# Patient Record
Sex: Female | Born: 1985 | Race: Black or African American | Hispanic: No | Marital: Single | Smoking: Never smoker
Health system: Southern US, Community
[De-identification: ages and names within clinical notes are randomized; demographics above are authoritative.]

## PROBLEM LIST (undated history)

## (undated) DIAGNOSIS — Z789 Other specified health status: Secondary | ICD-10-CM

## (undated) HISTORY — PX: WISDOM TOOTH EXTRACTION: SHX21

---

## 2017-05-18 LAB — OB RESULTS CONSOLE ABO/RH: RH Type: POSITIVE

## 2017-05-18 LAB — OB RESULTS CONSOLE HEPATITIS B SURFACE ANTIGEN: Hepatitis B Surface Ag: NEGATIVE

## 2017-05-18 LAB — OB RESULTS CONSOLE GC/CHLAMYDIA
CHLAMYDIA, DNA PROBE: NEGATIVE
GC PROBE AMP, GENITAL: NEGATIVE

## 2017-05-18 LAB — OB RESULTS CONSOLE HIV ANTIBODY (ROUTINE TESTING): HIV: NONREACTIVE

## 2017-05-18 LAB — OB RESULTS CONSOLE ANTIBODY SCREEN: ANTIBODY SCREEN: NEGATIVE

## 2017-05-18 LAB — OB RESULTS CONSOLE RPR: RPR: NONREACTIVE

## 2017-05-18 LAB — OB RESULTS CONSOLE RUBELLA ANTIBODY, IGM: RUBELLA: IMMUNE

## 2017-05-30 NOTE — L&D Delivery Note (Signed)
Delivery Note Pt eventually reached complete dilation OP presentation.  She continued to have intermittent variable decels but always responded to position change.   With pushing she had deeper decelerations to 70's, but would recover between.  Pt pushed very for about 15 minutes and spontaneously rotated the vertex to OA.   At 5:42 PM a viable female was delivered via Vaginal, Spontaneous (Presentation: ROA ).  APGAR:7 ,8 ; weight pending .  NICU was present at delivery for decelerations, but baby only required stimulation and did well.   Placenta status: delivered spontaneously.  Cord:  with the following complications: nuchal x 1 reduced.    Anesthesia:  epidural Episiotomy: None Lacerations: Periurethral abrasion Suture Repair: 3.0 vicryl rapide Est. Blood Loss (mL): 175mL  Mom to postpartum.  Baby to Couplet care / Skin to Skin. D/w pt circumcision and they desire to proceed, have paid at office and need to pay at hospital  Oliver PilaKathy W Kea Callan 12/15/2017, 5:57 PM

## 2017-11-22 LAB — OB RESULTS CONSOLE GBS: GBS: NEGATIVE

## 2017-12-15 ENCOUNTER — Encounter (HOSPITAL_COMMUNITY): Payer: Self-pay

## 2017-12-15 ENCOUNTER — Other Ambulatory Visit: Payer: Self-pay

## 2017-12-15 ENCOUNTER — Encounter (HOSPITAL_COMMUNITY): Payer: Self-pay | Admitting: *Deleted

## 2017-12-15 ENCOUNTER — Inpatient Hospital Stay (HOSPITAL_COMMUNITY)
Admission: AD | Admit: 2017-12-15 | Discharge: 2017-12-17 | DRG: 807 | Disposition: A | Discharge status: Home / Self Care | Payer: Medicaid Other | Attending: Obstetrics and Gynecology | Admitting: Obstetrics and Gynecology

## 2017-12-15 ENCOUNTER — Inpatient Hospital Stay (HOSPITAL_COMMUNITY): Payer: Medicaid Other | Admitting: Anesthesiology

## 2017-12-15 ENCOUNTER — Inpatient Hospital Stay (EMERGENCY_DEPARTMENT_HOSPITAL)
Admission: AD | Admit: 2017-12-15 | Discharge: 2017-12-15 | Disposition: A | Discharge status: Home / Self Care | Payer: Medicaid Other | Source: Ambulatory Visit | Attending: Obstetrics and Gynecology | Admitting: Obstetrics and Gynecology

## 2017-12-15 DIAGNOSIS — Z3A39 39 weeks gestation of pregnancy: Secondary | ICD-10-CM

## 2017-12-15 DIAGNOSIS — Z3480 Encounter for supervision of other normal pregnancy, unspecified trimester: Secondary | ICD-10-CM

## 2017-12-15 DIAGNOSIS — O479 False labor, unspecified: Secondary | ICD-10-CM

## 2017-12-15 DIAGNOSIS — O321XX0 Maternal care for breech presentation, not applicable or unspecified: Secondary | ICD-10-CM | POA: Diagnosis not present

## 2017-12-15 DIAGNOSIS — Z3483 Encounter for supervision of other normal pregnancy, third trimester: Secondary | ICD-10-CM | POA: Diagnosis present

## 2017-12-15 DIAGNOSIS — O329XX0 Maternal care for malpresentation of fetus, unspecified, not applicable or unspecified: Secondary | ICD-10-CM

## 2017-12-15 HISTORY — DX: Other specified health status: Z78.9

## 2017-12-15 LAB — CBC
HEMATOCRIT: 33.6 % — AB (ref 36.0–46.0)
HEMOGLOBIN: 11.6 g/dL — AB (ref 12.0–15.0)
MCH: 32.9 pg (ref 26.0–34.0)
MCHC: 34.5 g/dL (ref 30.0–36.0)
MCV: 95.2 fL (ref 78.0–100.0)
Platelets: 251 10*3/uL (ref 150–400)
RBC: 3.53 MIL/uL — AB (ref 3.87–5.11)
RDW: 14.5 % (ref 11.5–15.5)
WBC: 10.3 10*3/uL (ref 4.0–10.5)

## 2017-12-15 LAB — ABO/RH: ABO/RH(D): B POS

## 2017-12-15 LAB — TYPE AND SCREEN
ABO/RH(D): B POS
ANTIBODY SCREEN: NEGATIVE

## 2017-12-15 LAB — RPR: RPR: NONREACTIVE

## 2017-12-15 MED ORDER — SOD CITRATE-CITRIC ACID 500-334 MG/5ML PO SOLN: 30.0000 mL | ORAL | Status: DC | PRN

## 2017-12-15 MED ORDER — OXYTOCIN BOLUS FROM INFUSION
500.0000 mL | Freq: Once | INTRAVENOUS | Status: AC
  Administered 2017-12-15: 500 mL via INTRAVENOUS

## 2017-12-15 MED ORDER — ONDANSETRON HCL 4 MG/2ML IJ SOLN: 4.0000 mg | Freq: Four times a day (QID) | INTRAMUSCULAR | Status: DC | PRN

## 2017-12-15 MED ORDER — ACETAMINOPHEN 325 MG PO TABS: 650.0000 mg | ORAL_TABLET | ORAL | Status: DC | PRN

## 2017-12-15 MED ORDER — TETANUS-DIPHTH-ACELL PERTUSSIS 5-2.5-18.5 LF-MCG/0.5 IM SUSP: 0.5000 mL | Freq: Once | INTRAMUSCULAR | Status: DC

## 2017-12-15 MED ORDER — PHENYLEPHRINE 40 MCG/ML (10ML) SYRINGE FOR IV PUSH (FOR BLOOD PRESSURE SUPPORT)
80.0000 ug | PREFILLED_SYRINGE | INTRAVENOUS | Status: DC | PRN
  Administered 2017-12-15: 80 ug via INTRAVENOUS
  Filled 2017-12-15: qty 5

## 2017-12-15 MED ORDER — IBUPROFEN 600 MG PO TABS
600.0000 mg | ORAL_TABLET | Freq: Four times a day (QID) | ORAL | Status: DC
  Administered 2017-12-15 – 2017-12-17 (×7): 600 mg via ORAL
  Filled 2017-12-15 (×6): qty 1

## 2017-12-15 MED ORDER — LIDOCAINE HCL (PF) 1 % IJ SOLN
INTRAMUSCULAR | Status: DC | PRN
  Administered 2017-12-15: 13 mL via EPIDURAL

## 2017-12-15 MED ORDER — BENZOCAINE-MENTHOL 20-0.5 % EX AERO
1.0000 "application " | INHALATION_SPRAY | CUTANEOUS | Status: DC | PRN
  Administered 2017-12-15: 1 via TOPICAL
  Filled 2017-12-15: qty 56

## 2017-12-15 MED ORDER — BUPIVACAINE HCL (PF) 0.25 % IJ SOLN
INTRAMUSCULAR | Status: AC
  Filled 2017-12-15: qty 30

## 2017-12-15 MED ORDER — TERBUTALINE SULFATE 1 MG/ML IJ SOLN
0.2500 mg | Freq: Once | INTRAMUSCULAR | Status: DC | PRN
  Filled 2017-12-15: qty 1

## 2017-12-15 MED ORDER — ZOLPIDEM TARTRATE 5 MG PO TABS: 5.0000 mg | ORAL_TABLET | Freq: Every evening | ORAL | Status: DC | PRN

## 2017-12-15 MED ORDER — LACTATED RINGERS IV SOLN
500.0000 mL | Freq: Once | INTRAVENOUS | Status: AC
  Administered 2017-12-15: 500 mL via INTRAVENOUS

## 2017-12-15 MED ORDER — FENTANYL CITRATE (PF) 100 MCG/2ML IJ SOLN
100.0000 ug | Freq: Once | INTRAMUSCULAR | Status: AC
  Administered 2017-12-15: 100 ug via INTRAVENOUS

## 2017-12-15 MED ORDER — BUTORPHANOL TARTRATE 1 MG/ML IJ SOLN: 1.0000 mg | INTRAMUSCULAR | Status: DC | PRN

## 2017-12-15 MED ORDER — DIBUCAINE 1 % RE OINT: 1.0000 "application " | TOPICAL_OINTMENT | RECTAL | Status: DC | PRN

## 2017-12-15 MED ORDER — PHENYLEPHRINE 40 MCG/ML (10ML) SYRINGE FOR IV PUSH (FOR BLOOD PRESSURE SUPPORT)
80.0000 ug | PREFILLED_SYRINGE | INTRAVENOUS | Status: DC | PRN
  Filled 2017-12-15: qty 5

## 2017-12-15 MED ORDER — SENNOSIDES-DOCUSATE SODIUM 8.6-50 MG PO TABS
2.0000 | ORAL_TABLET | ORAL | Status: DC
  Administered 2017-12-16: 2 via ORAL
  Filled 2017-12-15 (×2): qty 2

## 2017-12-15 MED ORDER — LACTATED RINGERS IV SOLN
500.0000 mL | INTRAVENOUS | Status: DC | PRN
  Administered 2017-12-15 (×2): 500 mL via INTRAVENOUS

## 2017-12-15 MED ORDER — LIDOCAINE HCL (PF) 1 % IJ SOLN
30.0000 mL | INTRAMUSCULAR | Status: DC | PRN
  Filled 2017-12-15: qty 30

## 2017-12-15 MED ORDER — DIPHENHYDRAMINE HCL 25 MG PO CAPS: 25.0000 mg | ORAL_CAPSULE | Freq: Four times a day (QID) | ORAL | Status: DC | PRN

## 2017-12-15 MED ORDER — OXYCODONE-ACETAMINOPHEN 5-325 MG PO TABS: 1.0000 | ORAL_TABLET | ORAL | Status: DC | PRN

## 2017-12-15 MED ORDER — LACTATED RINGERS IV SOLN
INTRAVENOUS | Status: DC
  Administered 2017-12-15: 10:00:00 via INTRAUTERINE

## 2017-12-15 MED ORDER — WITCH HAZEL-GLYCERIN EX PADS: 1.0000 "application " | MEDICATED_PAD | CUTANEOUS | Status: DC | PRN

## 2017-12-15 MED ORDER — OXYCODONE-ACETAMINOPHEN 5-325 MG PO TABS: 2.0000 | ORAL_TABLET | ORAL | Status: DC | PRN

## 2017-12-15 MED ORDER — PRENATAL MULTIVITAMIN CH
1.0000 | ORAL_TABLET | Freq: Every day | ORAL | Status: DC
  Administered 2017-12-16 – 2017-12-17 (×2): 1 via ORAL
  Filled 2017-12-15 (×2): qty 1

## 2017-12-15 MED ORDER — FENTANYL 2.5 MCG/ML BUPIVACAINE 1/10 % EPIDURAL INFUSION (WH - ANES)
14.0000 mL/h | INTRAMUSCULAR | Status: DC | PRN
  Administered 2017-12-15 (×2): 14 mL/h via EPIDURAL
  Filled 2017-12-15: qty 100

## 2017-12-15 MED ORDER — FENTANYL CITRATE (PF) 100 MCG/2ML IJ SOLN
INTRAMUSCULAR | Status: AC
  Administered 2017-12-15: 100 ug via INTRAVENOUS
  Filled 2017-12-15: qty 2

## 2017-12-15 MED ORDER — ONDANSETRON HCL 4 MG PO TABS: 4.0000 mg | ORAL_TABLET | ORAL | Status: DC | PRN

## 2017-12-15 MED ORDER — EPHEDRINE 5 MG/ML INJ
10.0000 mg | INTRAVENOUS | Status: DC | PRN
  Filled 2017-12-15: qty 2

## 2017-12-15 MED ORDER — PHENYLEPHRINE 40 MCG/ML (10ML) SYRINGE FOR IV PUSH (FOR BLOOD PRESSURE SUPPORT)
PREFILLED_SYRINGE | INTRAVENOUS | Status: AC
  Administered 2017-12-15: 80 ug via INTRAVENOUS
  Filled 2017-12-15: qty 10

## 2017-12-15 MED ORDER — SIMETHICONE 80 MG PO CHEW: 80.0000 mg | CHEWABLE_TABLET | ORAL | Status: DC | PRN

## 2017-12-15 MED ORDER — DIPHENHYDRAMINE HCL 50 MG/ML IJ SOLN: 12.5000 mg | INTRAMUSCULAR | Status: DC | PRN

## 2017-12-15 MED ORDER — COCONUT OIL OIL: 1.0000 "application " | TOPICAL_OIL | Status: DC | PRN

## 2017-12-15 MED ORDER — FENTANYL 2.5 MCG/ML BUPIVACAINE 1/10 % EPIDURAL INFUSION (WH - ANES)
INTRAMUSCULAR | Status: AC
  Filled 2017-12-15: qty 100

## 2017-12-15 MED ORDER — LACTATED RINGERS IV SOLN
INTRAVENOUS | Status: DC
  Administered 2017-12-15: 10:00:00 via INTRAVENOUS

## 2017-12-15 MED ORDER — EPHEDRINE 5 MG/ML INJ
10.0000 mg | INTRAVENOUS | Status: DC | PRN
Start: 2017-12-15 — End: 2017-12-15
  Filled 2017-12-15: qty 2

## 2017-12-15 MED ORDER — ONDANSETRON HCL 4 MG/2ML IJ SOLN: 4.0000 mg | INTRAMUSCULAR | Status: DC | PRN

## 2017-12-15 MED ORDER — OXYTOCIN 40 UNITS IN LACTATED RINGERS INFUSION - SIMPLE MED
2.5000 [IU]/h | INTRAVENOUS | Status: DC
  Filled 2017-12-15: qty 1000

## 2017-12-15 MED ORDER — OXYTOCIN 40 UNITS IN LACTATED RINGERS INFUSION - SIMPLE MED
1.0000 m[IU]/min | INTRAVENOUS | Status: DC
  Administered 2017-12-15: 2 m[IU]/min via INTRAVENOUS

## 2017-12-15 NOTE — Discharge Instructions (Signed)
Cesarean Delivery °Cesarean birth, or cesarean delivery, is the surgical delivery of a baby through an incision in the abdomen and the uterus. This may be referred to as a C-section. This procedure may be scheduled ahead of time, or it may be done in an emergency situation. °Tell a health care provider about: °· Any allergies you have. °· All medicines you are taking, including vitamins, herbs, eye drops, creams, and over-the-counter medicines. °· Any problems you or family members have had with anesthetic medicines. °· Any blood disorders you have. °· Any surgeries you have had. °· Any medical conditions you have. °· Whether you or any members of your family have a history of deep vein thrombosis (DVT) or pulmonary embolism (PE). °What are the risks? °Generally, this is a safe procedure. However, problems may occur, including: °· Infection. °· Bleeding. °· Allergic reactions to medicines. °· Damage to other structures or organs. °· Blood clots. °· Injury to your baby. ° °What happens before the procedure? °· Follow instructions from your health care provider about eating or drinking restrictions. °· Follow instructions from your health care provider about bathing before your procedure to help reduce your risk of infection. °· If you know that you are going to have a cesarean delivery, do not shave your pubic area. Shaving before the procedure may increase your risk of infection. °· Ask your health care provider about: °? Changing or stopping your regular medicines. This is especially important if you are taking diabetes medicines or blood thinners. °? Your pain management plan. This is especially important if you plan to breastfeed your baby. °? How long you will be in the hospital after the procedure. °? Any concerns you may have about receiving blood products if you need them during the procedure. °? Cord blood banking, if you plan to collect your baby’s umbilical cord blood. °· You may also want to ask your  health care provider: °? Whether you will be able to hold or breastfeed your baby while you are still in the operating room. °? Whether your baby can stay with you immediately after the procedure and during your recovery. °? Whether a family member or a person of your choice can go with you into the operating room and stay with you during the procedure, immediately after the procedure, and during your recovery. °· Plan to have someone drive you home when you are discharged from the hospital. °What happens during the procedure? °· Fetal monitors will be placed on your abdomen to monitor your heart rate and your baby's heart rate. °· Depending on the reason for your cesarean delivery, you may have a physical exam or additional testing, such as an ultrasound. °· An IV tube will be inserted into one of your veins. °· You may have your blood or urine tested. °· You will be given antibiotic medicine to help prevent infection. °· You may be given a special warming gown to wear to keep your temperature stable. °· Hair may be removed from your pubic area. °· The skin of your pubic area and lower abdomen will be cleaned with a germ-killing solution (antiseptic). °· A catheter may be inserted into your bladder through your urethra. This drains your urine during the procedure. °· You may be given one or more of the following: °? A medicine to numb the area (local anesthetic). °? A medicine to make you fall asleep (general anesthetic). °? A medicine (regional anesthetic) that is injected into your back or through a small   thin tube placed in your back (spinal anesthetic or epidural anesthetic). This numbs everything below the injection site and allows you to stay awake during your procedure. If this makes you feel nauseous, tell your health care provider. Medicines will be available to help reduce any nausea you may feel.  An incision will be made in your abdomen, and then in your uterus.  If you are awake during your  procedure, you may feel tugging and pulling in your abdomen, but you should not feel pain. If you feel pain, tell your health care provider immediately.  Your baby will be removed from your uterus. You may feel more pressure or pushing while this happens.  Immediately after birth, your baby will be dried and kept warm. You may be able to hold and breastfeed your baby. The umbilical cord may be clamped and cut during this time.  Your placenta will be removed from your uterus.  Your incisions will be closed with stitches (sutures). Staples, skin glue, or adhesive strips may also be applied to the incision in your abdomen.  Bandages (dressings) will be placed over the incision in your abdomen. The procedure may vary among health care providers and hospitals. What happens after the procedure?  Your blood pressure, heart rate, breathing rate, and blood oxygen level will be monitored often until the medicines you were given have worn off.  You may continue to receive fluids and medicines through an IV tube.  You will have some pain. Medicines will be available to help control your pain.  To help prevent blood clots: ? You may be given medicines. ? You may have to wear compression stockings or devices. ? You will be encouraged to walk around when you are able.  Hospital staff will encourage and support bonding with your baby. Your hospital may allow you and your baby to stay in the same room (rooming in) during your hospital stay to encourage successful breastfeeding.  You may be encouraged to cough and breathe deeply often. This helps to prevent lung problems.  If you have a catheter draining your urine, it will be removed as soon as possible after your procedure. This information is not intended to replace advice given to you by your health care provider. Make sure you discuss any questions you have with your health care provider. Document Released: 05/16/2005 Document Revised: 10/22/2015  Document Reviewed: 02/24/2015 Elsevier Interactive Patient Education  2018 [employer]  Breech Birth What is a breech birth? A breech birth is when a baby is born with the buttocks or the feet first. Most babies are in a head down (vertex) position when they are born. There are three types of breech babies:  When the babys buttocks are showing first in the birth canal (vagina) with the legs straight up and the feet at the babys head (frank breech).  When the babys buttocks shows first with the legs bent at the knees and the feet down near the buttocks (complete breech).  When one or both of the babys feet are down below the buttocks (footling breech).  What are the risks of a breech birth? Having a breech birth increases the risk to your baby. A breech birth may cause the following:  Umbilical cord prolapse. This is when the umbilical cord is in front of the baby before or during labor. This can cause the cord to become pinched or compressed. This can reduce the flow of blood and oxygen to the baby.  The baby getting stuck  in the birth canal, which can cause injury or, rarely, death.  Injury to the nerves in the shoulder, arm, and hand (brachial plexus injury) when delivered.  Your baby being born too early (prematurely).  An increased need for a cesarean delivery.  What increases the risk of having a breech baby? It is not known what causes your baby to be breech. However, risk factors that may increase your chances of having a breech baby include the following:  The mother having had several babies already.  The mother having twins or more.  The mother having a baby with certain congenital disabilities.  The mother going into labor early.  The mother having problems with her uterus, such as a tumor.  The mother having placenta problems (placenta previa) or too much or not enough fluid surrounding the baby (amniotic fluid).  How do I know if my baby is breech? There  are no symptoms for you to know that your baby is breech. When you are close to your due date, your health care provider can tell if your baby is breech by:  An abdominal or vaginal (pelvic) exam.  An ultrasound.  Your health care provider may also be able to tell that your baby is breech if your babys heartbeat is heard above your belly button. What can be done if my baby is breech?  Your health care provider may try to turn the baby in your uterus. This is a procedure called external cephalic version (ECV). This is done by your health care provider. He or she will place both hands on your abdomen and gently and slowly turn the baby around. It is important to know that ECV can increase your chances of suddenly going into labor. If an ECV is done, it is done toward the end of a healthy pregnancy. The baby may remain in this position or he or she may turn back to the breech position. You and your health care provider will discuss if an ECV is recommended for you and your baby. How will I delivery my baby if my baby is breech? You and your health care provider will discuss the best way to deliver your baby. If your baby is breech, it is less likely that a vaginal delivery will be recommended due to the risks. Some breech babies may be delivered safely without a cesarean, while in other cases health care providers will recommend a cesarean delivery. This information is not intended to replace advice given to you by your health care provider. Make sure you discuss any questions you have with your health care provider. Document Released: 07/07/2006 Document Revised: 05/02/2016 Document Reviewed: 03/20/2014 Elsevier Interactive Patient Education  2017 [employer].

## 2017-12-15 NOTE — Anesthesia Preprocedure Evaluation (Signed)

## 2017-12-15 NOTE — Anesthesia Procedure Notes (Signed)
Epidural Patient location during procedure: OB Start time: 12/15/2017 8:11 AM End time: 12/15/2017 8:25 AM  Staffing Anesthesiologist: Lowella CurbMiller, Fonda Rochon Ray, MD Performed: anesthesiologist   Preanesthetic Checklist Completed: patient identified, site marked, surgical consent, pre-op evaluation, timeout performed, IV checked, risks and benefits discussed and monitors and equipment checked  Epidural Patient position: sitting Prep: ChloraPrep Patient monitoring: heart rate, cardiac monitor, continuous pulse ox and blood pressure Approach: midline Location: L2-L3 Injection technique: LOR saline  Needle:  Needle type: Tuohy  Needle gauge: 17 G Needle length: 9 cm Needle insertion depth: 4 cm Catheter type: closed end flexible Catheter size: 20 Guage Catheter at skin depth: 8 cm Test dose: negative  Assessment Events: blood not aspirated, injection not painful, no injection resistance, negative IV test and no paresthesia  Additional Notes Reason for block:procedure for pain

## 2017-12-15 NOTE — Lactation Note (Signed)
This note was copied from a baby's chart. Lactation Consultation Note  Patient Name: Jacqueline Tran Reason for consult: Initial assessment;Term  P3 mother whose infant is now 575 hours old.  Mother breastfed her first two children (Ages 3 and 8)  Baby is sleeping in grandfather's arms as I entered.  Mother feels like breastfeeding is going well so far and baby is latching appropriately.  She feels uterine contractions during feedings and no pain with latch.    Encouraged mother to feed 8-12 times/24 hours or more if baby shows feeding cues.  Reviewed feeding cues with mother.  Encouraged STS, breast massage and hand expression.  Colostrum container provided and mother will feed any EBM she obtains with hand expression.  In talking with her it became apparent that she remembered a lot about breastfeeding.  She asked appropriate questions.  I offered to observe a latch with the next feed and mother will call as needed for assistance.  Mom made aware of O/P services, breastfeeding support groups, community resources, and our phone # for post-discharge questions.  Family present.   Maternal Data Formula Feeding for Exclusion: No Has patient been taught Hand Expression?: Yes Does the patient have breastfeeding experience prior to this delivery?: Yes  Feeding Feeding Type: Breast Fed Length of feed: 15 min  LATCH Score Latch: Repeated attempts needed to sustain latch, nipple held in mouth throughout feeding, stimulation needed to elicit sucking reflex.  Audible Swallowing: A few with stimulation  Type of Nipple: Everted at rest and after stimulation  Comfort (Breast/Nipple): Soft / non-tender  Hold (Positioning): Full assist, staff holds infant at breast  LATCH Score: 6  Interventions    Lactation Tools Discussed/Used     Consult Status Consult Status: Follow-up Date: 12/16/17 Follow-up type: In-patient    Everson Mott R [name], 11:13  PM

## 2017-12-15 NOTE — Progress Notes (Signed)
Patient ID: Jacqueline Tran, female   DOB: 01-12-1986, 32 y.o.   MRN: [id number] Pt having variable decels.  Good variability and +accelerations  Cervix unchanged 90/6/-2  IUPC placed for amnioinfusion Will augment with pitocin if tracing allows. Follow progress

## 2017-12-15 NOTE — MAU Provider Note (Signed)
Chief Complaint:  Labor Eval   First Provider Initiated Contact with Patient 12/15/17 0304     HPI  HPI: Jacqueline Tran is a 32 y.o. G3P2002 at 39w1dwho presents to maternity admissions reporting labor.  She was being evaluated for labor by RN when the RN noted abnormal position.. She reports good fetal movement, denies LOF, vaginal itching/burning, urinary symptoms, h/a, dizziness, n/v, diarrhea, constipation or fever/chills.    Past Medical History: Past Medical History:  Diagnosis Date  . Medical history non-contributory     Past obstetric history: OB History  Gravida Para Term Preterm AB Living  3 2 2     2   SAB TAB Ectopic Multiple Live Births          2    # Outcome Date GA Lbr Len/2nd Weight Sex Delivery Anes PTL Lv  3 Current           2 Term 67     Vag-Spont   LIV  1 Term 2010     Vag-Spont   LIV    Past Surgical History: Past Surgical History:  Procedure Laterality Date  . WISDOM TOOTH EXTRACTION      Family History: No family history on file.  Social History: Social History   Tobacco Use  . Smoking status: Not on file  Substance Use Topics  . Alcohol use: Not on file  . Drug use: Not on file    Allergies:  Allergies  Allergen Reactions  . Ceclor [Cefaclor] Other (See Comments)    Unknown reaction  . Dimetapp Decongestant [Pseudoephedrine] Other (See Comments)    Unknown reaction    Meds:  Medications Prior to Admission  Medication Sig Dispense Refill Last Dose  . ferrous sulfate 325 (65 FE) MG EC tablet Take 325 mg by mouth 3 (three) times daily with meals.   12/14/2017 at Unknown time  . Prenatal Vit-Fe Fumarate-FA (MULTIVITAMIN-PRENATAL) 27-0.8 MG TABS tablet Take 1 tablet by mouth daily at 12 noon.   12/14/2017 at Unknown time    I have reviewed patient's Past Medical Hx, Surgical Hx, Family Hx, Social Hx, medications and allergies.   ROS:  Review of Systems  Constitutional: Negative for chills and fever.  Gastrointestinal:  Positive for abdominal pain.  Genitourinary: Positive for pelvic pain.  Musculoskeletal: Positive for back pain.   Other systems negative  Physical Exam   Patient Vitals for the past 24 hrs:  BP Temp Temp src Pulse Resp SpO2 Height Weight  12/15/17 0210 139/65 98 F (36.7 C) Oral 89 20 100 % 5\' 5"  (1.651 m) 150 lb (68 kg)   Constitutional: Well-developed, well-nourished female in no acute distress.  Cardiovascular: normal rate  Respiratory: normal effort,  GI: Abd soft, non-tender, gravid appropriate for gestational age.    MS: Extremities nontender, no edema, normal ROM Neurologic: Alert and oriented x 4.  GU: Neg CVAT.  PELVIC EXAM: Dilation: 3 Effacement (%): 60 Station: -3 Presentation: Undeterminable Exam by:: Pharmacologist  FHT:  Baseline 140 , moderate variability, accelerations present, no decelerations Contractions:  Irregular     Labs:    Imaging:  Pt informed that the ultrasound is considered a limited OB ultrasound and is not intended to be a complete ultrasound exam.  Patient also informed that the ultrasound is not being completed with the intent of assessing for fetal or placental anomalies or any pelvic abnormalities.  Explained that the purpose of today's ultrasound is to assess for presentation  Patient acknowledges the  purpose of the exam and the limitations of the study.    Fetus noted to be in the complete breech presentation, with longitudinal lie Head is in maternal LUQ.  Breech in in pelvis, though not engaged. Spine is along right side.  Pockets of amniotic fluid seen, not measured  MAU Course/MDM: RN will notify MD with report of fetal malpresentation  Assessment: Single intrauterine pregnancy at 7249w1d Malpresentation of fetus, breech  Plan: Discharge home Labor precautions and fetal kick counts Follow up in Office for prenatal visits and recheck  Encouraged to return here or to other Urgent Care/ED if she develops worsening of symptoms,  increase in pain, fever, or other concerning symptoms.   Pt stable at time of discharge.  Wynelle BourgeoisMarie Rashan Rounsaville CNM, MSN Certified Nurse-Midwife 12/15/2017 3:05 AM

## 2017-12-15 NOTE — MAU Note (Signed)
Pt here for irregular contractions all day now every 7-10 mins. Pt reports some bloody show and spotting. Denies LOF. Reports good fetal movement. Cervix was 3cm on Wednesday

## 2017-12-15 NOTE — Progress Notes (Signed)
Patient ID: Jacqueline Tran, female   DOB: 06-17-1985, 32 y.o.   MRN: [id number] Pt has been comfortable with epidural, some pressure She has had runs of deep variable decelerations which responded to amnioinfusion and repositioning.    Currently, has finally had 30 minutes with no variable decelerations and a category 1 strip so able to start pitocin augmentation.    Per RN exam, minimal change.  Vertex asynclitic and OP--will attempt to increase intensity of contractions to help pt progress if baby tolerates.

## 2017-12-15 NOTE — MAU Note (Signed)
I have communicated with Dr. Ellyn HackBovard and reviewed vital signs:  Vitals:   12/15/17 0210  BP: 139/65  Pulse: 89  Resp: 20  Temp: 98 F (36.7 C)  SpO2: 100%    Vaginal exam:  Dilation: 3 Effacement (%): 60 Station: -3 Presentation: Undeterminable Exam by:: Ciscomber Juno Bozard RN,   Also reviewed contraction pattern and that non-stress test is reactive.  It has been documented that patient is contracting every 9-10 minutes with no cervical change since last exam in the office, not indicating active labor.  Patient denies any other complaints. Breech presentation confirmed by Artelia LarocheM Williams CNM. Based on this report provider has given order for discharge.  A discharge order and diagnosis entered by a provider.   Labor discharge instructions reviewed with patient.

## 2017-12-15 NOTE — H&P (Signed)
Jacqueline Tran is a 32 y.o. female G3P2002 at 39+ with ctx and cervical change.  Was breech on check last night.  By US this AM vtx.  Relatively uncomplicated PNC, Pt is gbbs neg, declined Tdap.  Had normal genetic screening.    OB History    Gravida  3   Para  2   Term  2   Preterm      AB      Living  2     SAB      TAB      Ectopic      Multiple      Live Births  2         G1 SVD 5#12, female G2 SVD female, 7#8  + abn pap - ASCUS - HR HPV + with pregnancy No STD  Past Medical History:  Diagnosis Date  . Medical history non-contributory    Past Surgical History:  Procedure Laterality Date  . WISDOM TOOTH EXTRACTION     Family History: family history is not on file. Social History:  has no tobacco, alcohol, and drug history on file. single in relationship  Meds PNV All ceclor and dimetapp     Maternal Diabetes: No Genetic Screening: Normal Maternal Ultrasounds/Referrals: Abnormal:  Findings:   Isolated EIF (echogenic intracardiac focus) Fetal Ultrasounds or other Referrals:  None Maternal Substance Abuse:  No Significant Maternal Medications:  None Significant Maternal Lab Results:  Lab values include: Group B Strep negative Other Comments:  EIF resolved  Review of Systems  Constitutional: Negative.   HENT: Negative.   Eyes: Negative.   Respiratory: Negative.   Cardiovascular: Negative.   Gastrointestinal: Negative.   Genitourinary: Negative.   Musculoskeletal: Negative.   Skin: Negative.   Neurological: Negative.   Psychiatric/Behavioral: Negative.    Maternal Medical History:  Reason for admission: Contractions.   Contractions: Perceived severity is moderate.    Fetal activity: Perceived fetal activity is normal.    Prenatal Complications - Diabetes: none.    Dilation: 5 Effacement (%): 80 Station: -2 Exam by:: PharmacologistAmber Stovall RN Blood pressure 120/79, pulse (!) 106, temperature 98.4 F (36.9 C), temperature source Oral,  resp. rate 20. Maternal Exam:  Uterine Assessment: Contraction strength is moderate.  Contraction frequency is regular.   Abdomen: Fundal height is appropriate for gestation.   Estimated fetal weight is 7.8- 8#.   Fetal presentation: vertex  Introitus: Normal vulva. Normal vagina.    Physical Exam  Constitutional: She is oriented to person, place, and time. She appears well-developed and well-nourished.  HENT:  Head: Normocephalic and atraumatic.  Cardiovascular: Normal rate and regular rhythm.  Respiratory: Breath sounds normal. No respiratory distress. She has no wheezes.  GI: Soft. Bowel sounds are normal. She exhibits no distension. There is no tenderness.  Musculoskeletal: Normal range of motion.  Neurological: She is alert and oriented to person, place, and time.  Skin: Skin is warm and dry.  Psychiatric: She has a normal mood and affect. Her behavior is normal.    Prenatal labs: ABO, Rh:  B+ Antibody:  neg Rubella:  immune RPR:   NR HBsAg:   neg HIV:   neg GBS:   neg  Hgb 13.4/Plt 336K/Ur Cx neg/Chl neg/GC neg/Hgb electo WNL/First tri screen neg/ glucola 117.  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

## 2017-12-15 NOTE — MAU Note (Signed)
Pt back here with worsening contractions and some spotting. Was breech earlier tonight.

## 2017-12-15 NOTE — Progress Notes (Signed)
Patient ID: Jacqueline JordanJonquil C Tran, female   DOB: [birth year], 32 y.o.   MRN: [id number] Pt received epidural and comfortable.  SROM with exam just prior to epidural--clear  afeb VSS FHR category 1 with occasional variables  Cervix 90/6/-2  FSE placed to monitor FHR, will see if vertex will descend now and add pitocin as needed

## 2017-12-16 LAB — CBC
HCT: 31.5 % — ABNORMAL LOW (ref 36.0–46.0)
Hemoglobin: 10.9 g/dL — ABNORMAL LOW (ref 12.0–15.0)
MCH: 33.4 pg (ref 26.0–34.0)
MCHC: 34.6 g/dL (ref 30.0–36.0)
MCV: 96.6 fL (ref 78.0–100.0)
Platelets: 210 10*3/uL (ref 150–400)
RBC: 3.26 MIL/uL — ABNORMAL LOW (ref 3.87–5.11)
RDW: 14.6 % (ref 11.5–15.5)
WBC: 19.7 10*3/uL — AB (ref 4.0–10.5)

## 2017-12-16 NOTE — Anesthesia Postprocedure Evaluation (Signed)
Anesthesia Post Note  Patient: Jacqueline Tran  Procedure(s) Performed: AN AD HOC LABOR EPIDURAL     Patient location during evaluation: Mother Baby Anesthesia Type: Epidural Level of consciousness: awake and alert Pain management: pain level controlled Vital Signs Assessment: post-procedure vital signs reviewed and stable Respiratory status: spontaneous breathing Cardiovascular status: stable Postop Assessment: no headache, patient able to bend at knees, no apparent nausea or vomiting, no backache, epidural receding, adequate PO intake and able to ambulate Anesthetic complications: no    Last Vitals:  Vitals:   12/16/17 0127 12/16/17 0523  BP: 110/68 100/68  Pulse: 76 85  Resp: 17 15  Temp: 37.2 C 37.2 C  SpO2: 99% 100%    Last Pain:  Vitals:   12/16/17 0525  TempSrc:   PainSc: 4    Pain Goal:                 Salome ArntSterling, Alistar Mcenery Marie

## 2017-12-16 NOTE — Progress Notes (Signed)
Post Partum Day 1 Subjective: no complaints and tolerating PO  Objective: Blood pressure 102/65, pulse 89, temperature 98.8 F (37.1 C), temperature source Oral, resp. rate 18, height 5\' 5"  (1.651 m), weight 68 kg (150 lb), SpO2 100 %, unknown if currently breastfeeding.  Physical Exam:  General: alert and cooperative Lochia: appropriate Uterine Fundus: firm  Recent Labs    12/15/17 0729 12/16/17 0547  HGB 11.6* 10.9*  HCT 33.6* 31.5*    Assessment/Plan: Plan for discharge tomorrow  Circumcision tomorrow   LOS: 1 day   Oliver PilaKathy W Wilmon Conover 12/16/2017, 2:06 PM

## 2017-12-17 MED ORDER — ACETAMINOPHEN 325 MG PO TABS: 650.0000 mg | ORAL_TABLET | ORAL | 0 refills | Status: AC | PRN

## 2017-12-17 MED ORDER — IBUPROFEN 600 MG PO TABS: 600.0000 mg | ORAL_TABLET | Freq: Four times a day (QID) | ORAL | 0 refills | Status: AC

## 2017-12-17 NOTE — Lactation Note (Signed)
This note was copied from a baby's chart. Lactation Consultation Note  Patient Name: Jacqueline Tran: 12/17/2017 Reason for consult: Follow-up assessment;Term;Infant weight loss;Other (Comment)(1 % weight loss / experienced BF )  Baby is 45 hours old, Bili - 29 hours old - 6.9  Baby has only had a small mec smear , no there stool.  D/C is being held up due to no stool.  According to the doc flow sheets - baby has been consistently breast feeding with Latch scores of 6 to start and  9-10 's since. 4 wets, 1 mec smear per parents.  Per mom feels her milk is coming in and hearing more swallows.  Baby last fed at 2:45 pm for 20 mins with increased swallows.  Mom denies soreness and comfortable latch.  Sore nipple and engorgement and tx reviewed. LC offered mom  A hand pump and she declined/ has a DEBP at home.  Mother informed of post-discharge support and given phone number to the lactation department, including services for phone call assistance; out-patient appointments; and breastfeeding support group. List of other breastfeeding resources in the community given in the handout. Encouraged mother to call for problems or concerns related to breastfeeding.   Maternal Data Has patient been taught Hand Expression?: Yes  Feeding Feeding Type: (PER MOM BABY LAST FED AT 2:45PM ) Length of feed: 20 min(per mom increased swallows )  LATCH Score                   Interventions Interventions: Breast feeding basics reviewed  Lactation Tools Discussed/Used WIC Program: No Pump Review: Milk Storage   Consult Status Consult Status: Complete Tran: 12/17/17    Jacqueline Tran 12/17/2017, 3:38 PM

## 2017-12-17 NOTE — Progress Notes (Signed)
Post Partum Day 2 Subjective: no complaints, up ad lib and tolerating PO  Objective: Blood pressure 109/72, pulse 72, temperature 97.7 F (36.5 C), resp. rate 18, height 5\' 5"  (1.651 m), weight 68 kg (150 lb), SpO2 100 %, unknown if currently breastfeeding.  Physical Exam:  General: alert and cooperative Lochia: appropriate Uterine Fundus: firm   Recent Labs    12/15/17 0729 12/16/17 0547  HGB 11.6* 10.9*  HCT 33.6* 31.5*    Assessment/Plan: Discharge home   LOS: 2 days   Oliver PilaKathy W Lacrystal Barbe 12/17/2017, 10:45 AM

## 2017-12-17 NOTE — Discharge Summary (Signed)
OB Discharge Summary     Patient Name: Jacqueline JordanJonquil C Tran DOB: 01-Jan-1986 MRN: [id number]  Date of admission: 12/15/2017 Delivering MD: Huel CoteICHARDSON, Aalaya Yadao   Date of discharge: 12/17/2017  Admitting diagnosis: LABOR Intrauterine pregnancy: 6491w1d     Secondary diagnosis:  Active Problems:   Normal pregnancy in multigravida   NSVD (normal spontaneous vaginal delivery)  Additional problems: unstable lie     Discharge diagnosis: Term Pregnancy Delivered                                                                                                Post partum procedures:none  Augmentation: AROM and Pitocin  Complications: None  Hospital course:  Onset of Labor With Vaginal Delivery     32 y.o. yo G3P3003 at 4391w1d was admitted in Active Labor on 12/15/2017. Patient had an uncomplicated labor course as follows:  Membrane Rupture Time/Date: 7:58 AM ,12/15/2017   Intrapartum Procedures: Episiotomy: None [1]                                         Lacerations:  Periurethral [8]  Patient had a delivery of a Viable infant. 12/15/2017  Information for the patient's newborn:  Jacqueline Tran, Boy Jacqueline Tran [[id number]][[id number]]  Delivery Method: Vag-Spont    Pateint had an uncomplicated postpartum course.  She is ambulating, tolerating a regular diet, passing flatus, and urinating well. Patient is discharged home in stable condition on 12/17/17.   Physical exam  Vitals:   12/16/17 1055 12/16/17 1400 12/16/17 2209 12/17/17 0604  BP: 102/65 116/68 102/67 109/72  Pulse: 89 74 78 72  Resp: 18 18    Temp: 98.8 F (37.1 C) 98.1 F (36.7 C) 98.3 F (36.8 C) 97.7 F (36.5 C)  TempSrc: Oral  Oral   SpO2:  100%    Weight:      Height:       General: alert and cooperative Lochia: appropriate Uterine Fundus: firm  Labs: Lab Results  Component Value Date   WBC 19.7 (H) 12/16/2017   HGB 10.9 (L) 12/16/2017   HCT 31.5 (L) 12/16/2017   MCV 96.6 12/16/2017   PLT 210 12/16/2017   No flowsheet data  found.  Discharge instruction: per After Visit Summary and "Baby and Me Booklet".  After visit meds:  Allergies as of 12/17/2017      Reactions   Ceclor [cefaclor] Other (See Comments)   Unknown reaction   Dimetapp Decongestant [pseudoephedrine] Other (See Comments)   Unknown reaction      Medication List    TAKE these medications   acetaminophen 325 MG tablet Commonly known as:  TYLENOL Take 2 tablets (650 mg total) by mouth every 4 (four) hours as needed (for pain scale < 4).   ferrous sulfate 325 (65 FE) MG EC tablet Take 325 mg by mouth daily with breakfast.   ibuprofen 600 MG tablet Commonly known as:  ADVIL,MOTRIN Take 1 tablet (600 mg total) by mouth every 6 (six) hours.   multivitamin-prenatal 27-0.8 MG  Tabs tablet Take 1 tablet by mouth daily at 12 noon.       Diet: routine diet  Activity: Advance as tolerated. Pelvic rest for 6 weeks.   Outpatient follow up:6 weeks Follow up Appt:No future appointments. Follow up Visit:No follow-ups on file.  Postpartum contraception: IUD Mirena  Newborn Data: Live born female  Birth Weight: 7 lb 13.2 oz (3550 g) APGAR: 8, 9  Newborn Delivery   Birth date/time:  12/15/2017 17:42:00 Delivery type:  Vaginal, Spontaneous     Baby Feeding: Breast Disposition:home with mother   12/17/2017 Oliver Pila, MD

## 2019-04-11 ENCOUNTER — Ambulatory Visit (INDEPENDENT_AMBULATORY_CARE_PROVIDER_SITE_OTHER): Payer: Medicaid Other

## 2019-04-11 ENCOUNTER — Other Ambulatory Visit: Payer: Self-pay

## 2019-04-11 ENCOUNTER — Encounter: Payer: Self-pay | Admitting: Emergency Medicine

## 2019-04-11 ENCOUNTER — Ambulatory Visit
Admission: EM | Admit: 2019-04-11 | Discharge: 2019-04-11 | Disposition: A | Discharge status: Home / Self Care | Payer: Medicaid Other | Attending: Emergency Medicine | Admitting: Emergency Medicine

## 2019-04-11 DIAGNOSIS — R2232 Localized swelling, mass and lump, left upper limb: Secondary | ICD-10-CM

## 2019-04-11 DIAGNOSIS — M79645 Pain in left finger(s): Secondary | ICD-10-CM

## 2019-04-11 NOTE — ED Triage Notes (Signed)
Pt presents to [facility] for assessment of left ring finger swelling and pain since 10/20.  Ring unable to be removed from that finger as well.  Does not remember a specific injury.

## 2019-04-11 NOTE — Discharge Instructions (Signed)
Recommend RICE: rest, ice, compression, elevation as needed for pain.   Cold therapy (ice packs) can be used to help swelling both after injury and after prolonged use of areas of chronic pain/aches.  For pain: recommend 350 mg-1000 mg of Tylenol (acetaminophen) and/or 200 mg - 800 mg of Advil (ibuprofen, Motrin) every 8 hours as needed.  May alternate between the two throughout the day as they are generally safe to take together.  DO NOT exceed more than 3000 mg of Tylenol or 3200 mg of ibuprofen in a 24 hour period as this could damage your stomach, kidneys, liver, or increase your bleeding risk. 

## 2019-04-11 NOTE — ED Notes (Signed)
Patient able to ambulate independently  

## 2019-04-11 NOTE — ED Provider Notes (Signed)
[facility]    CSN: [id number] Arrival date & time: 04/11/19  0900      History   Chief Complaint Chief Complaint  Patient presents with  . APPT: 9:00am  . Finger Injury    HPI Jacqueline Tran is a 33 y.o. female presenting for persistent left ring finger pain, swelling (worse at PIP, MCP) since 10/20.  Patient feels she injured her finger, though cannot remember specific event.  Patient is wearing a ring on this finger, cannot get it off, though is not showing signs of strangulation.  No change in sensation.  Patient requesting x-ray   Past Medical History:  Diagnosis Date  . Medical history non-contributory     Patient Active Problem List   Diagnosis Date Noted  . Normal pregnancy in multigravida 12/15/2017  . NSVD (normal spontaneous vaginal delivery) 12/15/2017    Past Surgical History:  Procedure Laterality Date  . WISDOM TOOTH EXTRACTION      OB History    Gravida  3   Para  3   Term  3   Preterm      AB      Living  3     SAB      TAB      Ectopic      Multiple  0   Live Births  3            Home Medications    Prior to Admission medications   Medication Sig Start Date End Date Taking? Authorizing Provider  ferrous sulfate 325 (65 FE) MG EC tablet Take 325 mg by mouth daily with breakfast.    Yes [provider]  Prenatal Vit-Fe Fumarate-FA (MULTIVITAMIN-PRENATAL) 27-0.8 MG TABS tablet Take 1 tablet by mouth daily at 12 noon.   Yes [provider]  acetaminophen (TYLENOL) 325 MG tablet Take 2 tablets (650 mg total) by mouth every 4 (four) hours as needed (for pain scale < 4). 12/17/17   Paula Compton, MD  ibuprofen (ADVIL,MOTRIN) 600 MG tablet Take 1 tablet (600 mg total) by mouth every 6 (six) hours. 12/17/17   Paula Compton, MD    Family History Family History  Problem Relation Age of Onset  . Hypertension Mother   . Diabetes Paternal Grandmother     Social History Social History    Tobacco Use  . Smoking status: Never Smoker  . Smokeless tobacco: Never Used  Substance Use Topics  . Alcohol use: Not Currently  . Drug use: Never     Allergies   Ceclor [cefaclor] and Dimetapp decongestant [pseudoephedrine]   Review of Systems Review of Systems  Constitutional: Negative for fatigue and fever.  Respiratory: Negative for cough and shortness of breath.   Cardiovascular: Negative for chest pain and palpitations.  Musculoskeletal:       Positive for left fourth digit pain, swelling  Neurological: Negative for weakness and numbness.     Physical Exam Triage Vital Signs ED Triage Vitals  Enc Vitals Group     BP 04/11/19 0913 126/88     Pulse Rate 04/11/19 0913 85     Resp 04/11/19 0913 16     Temp 04/11/19 0913 98.2 F (36.8 C)     Temp Source 04/11/19 0913 Oral     SpO2 04/11/19 0913 100 %     Weight --      Height --      Head Circumference --      Peak Flow --  Pain Score 04/11/19 0917 2     Pain Loc --      Pain Edu? --      Excl. in GC? --    No data found.  Updated Vital Signs BP 126/88 (BP Location: Left Arm)   Pulse 85   Temp 98.2 F (36.8 C) (Oral)   Resp 16   SpO2 100%   Visual Acuity Right Eye Distance:   Left Eye Distance:   Bilateral Distance:    Right Eye Near:   Left Eye Near:    Bilateral Near:     Physical Exam Constitutional:      General: She is not in acute distress. HENT:     Head: Normocephalic and atraumatic.  Eyes:     General: No scleral icterus.    Conjunctiva/sclera: Conjunctivae normal.     Pupils: Pupils are equal, round, and reactive to light.  Cardiovascular:     Rate and Rhythm: Normal rate.  Pulmonary:     Effort: Pulmonary effort is normal. No respiratory distress.  Musculoskeletal: Normal range of motion.        General: Swelling and tenderness present. No deformity.     Comments: Tender over PIP, MCP of left fourth digit.  Ring is not strangulated.  No discoloration.  Neurovascularly  intact.  Skin:    Capillary Refill: Capillary refill takes less than 2 seconds.     Findings: No bruising, erythema or rash.  Neurological:     General: No focal deficit present.     Mental Status: She is alert.      UC Treatments / Results  Labs (all labs ordered are listed, but only abnormal results are displayed) Labs Reviewed - No data to display  EKG   Radiology Dg Finger Ring Left  Result Date: 04/11/2019 CLINICAL DATA:  Injury 20 days ago.  Pain and swelling. EXAM: LEFT RING FINGER 2+V COMPARISON:  None. FINDINGS: There is no evidence of fracture or dislocation. There is no evidence of arthropathy or other focal bone abnormality. Soft tissues are unremarkable. IMPRESSION: Negative. Electronically Signed   By: Marlan Palauharles  Clark M.D.   On: 04/11/2019 09:46    Procedures Procedures (including critical care time)  Medications Ordered in UC Medications - No data to display  Initial Impression / Assessment and Plan / UC Course  I have reviewed the triage vital signs and the nursing notes.  Pertinent labs & imaging results that were available during my care of the patient were reviewed by me and considered in my medical decision making (see chart for details).     X-ray done in office, reviewed by me & radiology: Negative for fracture, dislocation.  Will practice RICE.  Patient to try removing ring when swelling goes down, though advised that she can call fire department, go to ER if needed for removal.  Reviewed signs of strangulation.  Return precautions discussed, patient verbalized understanding and is agreeable to plan. Final Clinical Impressions(s) / UC Diagnoses   Final diagnoses:  Finger pain, left     Discharge Instructions     Recommend RICE: rest, ice, compression, elevation as needed for pain.   Cold therapy (ice packs) can be used to help swelling both after injury and after prolonged use of areas of chronic pain/aches.  For pain: recommend 350 mg-1000 mg  of Tylenol (acetaminophen) and/or 200 mg - 800 mg of Advil (ibuprofen, Motrin) every 8 hours as needed.  May alternate between the two throughout the day as they are  generally safe to take together.  DO NOT exceed more than 3000 mg of Tylenol or 3200 mg of ibuprofen in a 24 hour period as this could damage your stomach, kidneys, liver, or increase your bleeding risk.     ED Prescriptions    None     PDMP not reviewed this encounter.   Hall[place]Potvin, [place], [place] 04/11/19 1347

## 2021-06-30 IMAGING — DX DG FINGER RING 2+V*L*
3 series · 3 of 3 positions shown · non-contrast
Comparison: None.

CLINICAL DATA: Injury 20 days ago.  Pain and swelling.

EXAM:
LEFT RING FINGER 2+V

[finger pa (1 of 2)]
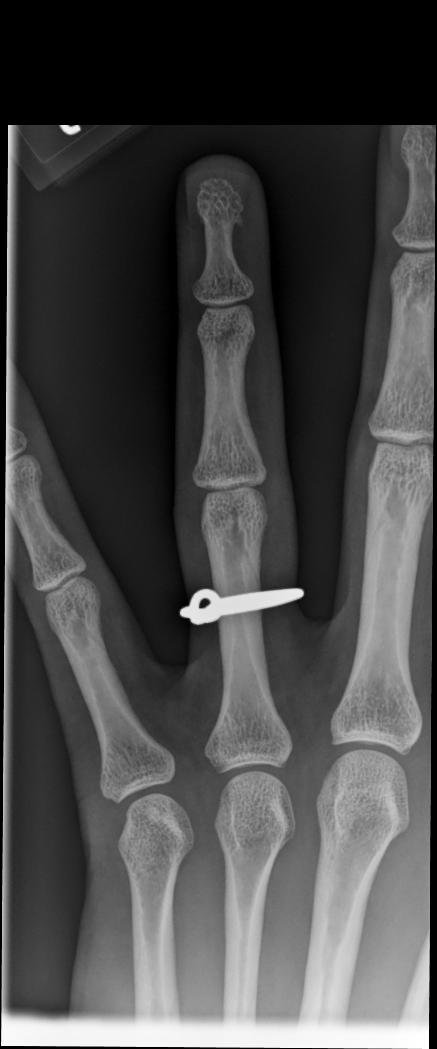

[finger pa (2 of 2)]
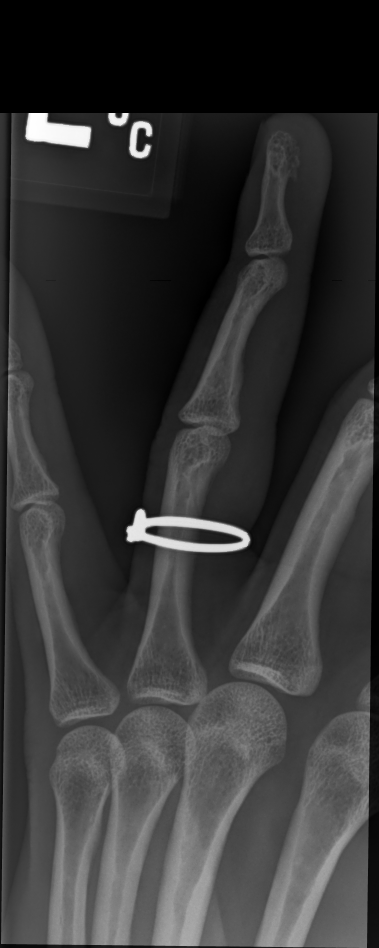

[finger lat]
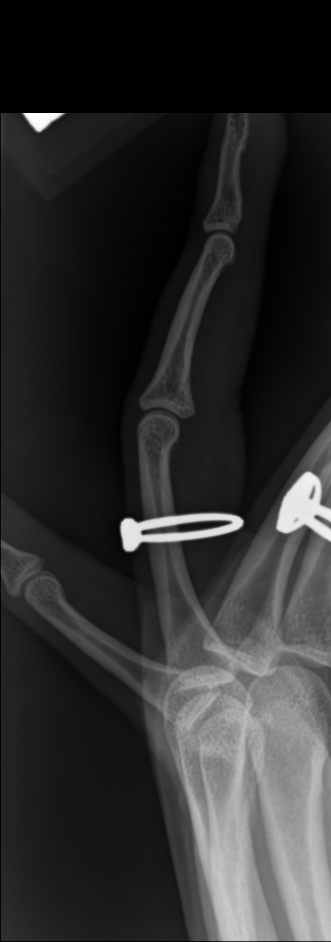

[3 of 3 positions shown; findings below may reference images not displayed]

FINDINGS: There is no evidence of fracture or dislocation. There is no
evidence of arthropathy or other focal bone abnormality. Soft
tissues are unremarkable.
IMPRESSION: Negative.
